# Patient Record
Sex: Male | Born: 1980 | Race: Black or African American | Hispanic: No | Marital: Single | Smoking: Current every day smoker
Health system: Southern US, Community
[De-identification: ages and names within clinical notes are randomized; demographics above are authoritative.]

---

## 2005-04-07 ENCOUNTER — Emergency Department: Payer: Self-pay | Admitting: Emergency Medicine

## 2008-01-21 ENCOUNTER — Emergency Department: Payer: Self-pay | Admitting: Emergency Medicine

## 2014-01-24 ENCOUNTER — Emergency Department: Payer: Self-pay | Admitting: Emergency Medicine

## 2016-07-16 ENCOUNTER — Ambulatory Visit: Payer: Self-pay | Admitting: Podiatry

## 2016-07-20 ENCOUNTER — Encounter: Payer: Self-pay | Admitting: Podiatry

## 2016-08-03 ENCOUNTER — Emergency Department
Admission: EM | Admit: 2016-08-03 | Discharge: 2016-08-03 | Disposition: A | Discharge status: Home / Self Care | Payer: Self-pay | Attending: Emergency Medicine | Admitting: Emergency Medicine

## 2016-08-03 ENCOUNTER — Encounter: Payer: Self-pay | Admitting: Emergency Medicine

## 2016-08-03 DIAGNOSIS — F17200 Nicotine dependence, unspecified, uncomplicated: Secondary | ICD-10-CM | POA: Insufficient documentation

## 2016-08-03 DIAGNOSIS — T148XXA Other injury of unspecified body region, initial encounter: Secondary | ICD-10-CM

## 2016-08-03 DIAGNOSIS — X58XXXA Exposure to other specified factors, initial encounter: Secondary | ICD-10-CM | POA: Insufficient documentation

## 2016-08-03 DIAGNOSIS — Y939 Activity, unspecified: Secondary | ICD-10-CM | POA: Insufficient documentation

## 2016-08-03 DIAGNOSIS — Y929 Unspecified place or not applicable: Secondary | ICD-10-CM | POA: Insufficient documentation

## 2016-08-03 DIAGNOSIS — S29012A Strain of muscle and tendon of back wall of thorax, initial encounter: Secondary | ICD-10-CM | POA: Insufficient documentation

## 2016-08-03 DIAGNOSIS — Y999 Unspecified external cause status: Secondary | ICD-10-CM | POA: Insufficient documentation

## 2016-08-03 MED ORDER — CYCLOBENZAPRINE HCL 10 MG PO TABS
5.0000 mg | ORAL_TABLET | Freq: Once | ORAL | Status: AC
  Administered 2016-08-03: 5 mg via ORAL
  Filled 2016-08-03: qty 1

## 2016-08-03 MED ORDER — TRAMADOL HCL 50 MG PO TABS: 50.0000 mg | ORAL_TABLET | Freq: Four times a day (QID) | ORAL | 0 refills | Status: AC | PRN

## 2016-08-03 MED ORDER — TRAMADOL HCL 50 MG PO TABS
50.0000 mg | ORAL_TABLET | Freq: Once | ORAL | Status: AC
  Administered 2016-08-03: 50 mg via ORAL
  Filled 2016-08-03: qty 1

## 2016-08-03 MED ORDER — CYCLOBENZAPRINE HCL 10 MG PO TABS: 10.0000 mg | ORAL_TABLET | Freq: Three times a day (TID) | ORAL | 0 refills | Status: DC | PRN

## 2016-08-03 NOTE — ED Triage Notes (Signed)
Upper mid back pain x1 day , no injury , increased pain on palpation , increased pain on deep inspirations.  No noted breathing difficulty.

## 2016-08-03 NOTE — ED Provider Notes (Signed)
[facility] Emergency Department Provider Note   ____________________________________________   First MD Initiated Contact with Patient 08/03/16 1125     (approximate)  I have reviewed the triage vital signs and the nursing notes.   HISTORY  Chief Complaint Back Pain    HPI Randy Shelton is a 35 y.o. male patient complaining of back pain on the right side for one day. Patient denies any provocative incident for this complaint. Patient state pain started upon arising from bed yesterday. Patient state mild relief taking ibuprofen. Patient denies any loss of sensation or loss of function of the upper extremity. Patient stated pain increases with abduction overhead reaching. Patient posted to the mid scapular area as a source of pain.Patient rates the pain as 8/10. Describes the pain as "sore". Patient is right-hand dominant.   History reviewed. No pertinent past medical history.  There are no active problems to display for this patient.   History reviewed. No pertinent surgical history.  Prior to Admission medications   Medication Sig Start Date End Date Taking? Authorizing Provider  cyclobenzaprine (FLEXERIL) 10 MG tablet Take 1 tablet (10 mg total) by mouth 3 (three) times daily as needed. 08/03/16   Joni Reiningonald K Bristal Steffy, PA-C  traMADol (ULTRAM) 50 MG tablet Take 1 tablet (50 mg total) by mouth every 6 (six) hours as needed. 08/03/16 08/03/17  Joni Reiningonald K Kamel Haven, PA-C    Allergies Review of patient's allergies indicates no known allergies.  History reviewed. No pertinent family history.  Social History Social History  Substance Use Topics  . Smoking status: Current Every Day Smoker  . Smokeless tobacco: Never Used  . Alcohol use Not on file    Review of Systems Constitutional: No fever/chills Eyes: No visual changes. ENT: No sore throat. Cardiovascular: Denies chest pain. Respiratory: Denies shortness of breath. Gastrointestinal: No abdominal  pain.  No nausea, no vomiting.  No diarrhea.  No constipation. Genitourinary: Negative for dysuria. Musculoskeletal: Positive for right upper back pain Skin: Negative for rash. Neurological: Negative for headaches, focal weakness or numbness. ____________________________________________   PHYSICAL EXAM:  VITAL SIGNS: ED Triage Vitals  Enc Vitals Group     BP 08/03/16 1043 (!) 150/84     Pulse Rate 08/03/16 1043 67     Resp 08/03/16 1043 18     Temp 08/03/16 1043 98.2 F (36.8 C)     Temp Source 08/03/16 1043 Oral     SpO2 08/03/16 1043 100 %     Weight 08/03/16 1044 191 lb (86.6 kg)     Height 08/03/16 1044 5\' 6"  (1.676 m)     Head Circumference --      Peak Flow --      Pain Score 08/03/16 1109 8     Pain Loc --      Pain Edu? --      Excl. in GC? --     Constitutional: Alert and oriented. Well appearing and in no acute distress. Eyes: Conjunctivae are normal. PERRL. EOMI. Head: Atraumatic. Nose: No congestion/rhinnorhea. Mouth/Throat: Mucous membranes are moist.  Oropharynx non-erythematous. Neck: No stridor.  No cervical spine tenderness to palpation. Hematological/Lymphatic/Immunilogical: No cervical lymphadenopathy. Cardiovascular: Normal rate, regular rhythm. Grossly normal heart sounds.  Good peripheral circulation. Respiratory: Normal respiratory effort.  No retractions. Lungs CTAB. Gastrointestinal: Soft and nontender. No distention. No abdominal bruits. No CVA tenderness. Musculoskeletal: No obvious deformity of the upper back. No edema or erythema. Patient has some moderate guarding with diminished capillary area medial aspect.  Neurologic:  Normal speech and language. No gross focal neurologic deficits are appreciated. No gait instability. Skin:  Skin is warm, dry and intact. No rash noted. Psychiatric: Mood and affect are normal. Speech and behavior are normal.  ____________________________________________   LABS (all labs ordered are listed, but only  abnormal results are displayed)  Labs Reviewed - No data to display ____________________________________________  EKG   ____________________________________________  RADIOLOGY   ____________________________________________   PROCEDURES  Procedure(s) performed: None  Procedures  Critical Care performed: No  ____________________________________________   INITIAL IMPRESSION / ASSESSMENT AND PLAN / ED COURSE  Pertinent labs & imaging results that were available during my care of the patient were reviewed by me and considered in my medical decision making (see chart for details).  Right scapular muscle strain. Patient given discharge Instructions. Patient given a prescription for tramadol and Flexeril. Patient advised to follow "clinic if condition persists.  Clinical Course     ____________________________________________   FINAL CLINICAL IMPRESSION(S) / ED DIAGNOSES  Final diagnoses:  Muscle strain      NEW MEDICATIONS STARTED DURING THIS VISIT:  New Prescriptions   CYCLOBENZAPRINE (FLEXERIL) 10 MG TABLET    Take 1 tablet (10 mg total) by mouth 3 (three) times daily as needed.   TRAMADOL (ULTRAM) 50 MG TABLET    Take 1 tablet (50 mg total) by mouth every 6 (six) hours as needed.     Note:  This document was prepared using Dragon voice recognition software and may include unintentional dictation errors.    Joni Reining, PA-C 08/03/16 1133    Governor Rooks, MD 08/03/16 [phone]

## 2016-11-05 NOTE — Progress Notes (Signed)
This encounter was created in error - please disregard.

## 2019-08-14 ENCOUNTER — Other Ambulatory Visit: Payer: Self-pay

## 2019-08-14 DIAGNOSIS — Z20822 Contact with and (suspected) exposure to covid-19: Secondary | ICD-10-CM

## 2019-08-16 LAB — NOVEL CORONAVIRUS, NAA: SARS-CoV-2, NAA: NOT DETECTED

## 2020-09-26 ENCOUNTER — Emergency Department
Admission: EM | Admit: 2020-09-26 | Discharge: 2020-09-26 | Disposition: A | Discharge status: Home / Self Care | Payer: Self-pay | Attending: Emergency Medicine | Admitting: Emergency Medicine

## 2020-09-26 ENCOUNTER — Emergency Department: Payer: Self-pay

## 2020-09-26 ENCOUNTER — Other Ambulatory Visit: Payer: Self-pay

## 2020-09-26 DIAGNOSIS — N433 Hydrocele, unspecified: Secondary | ICD-10-CM | POA: Insufficient documentation

## 2020-09-26 DIAGNOSIS — F17200 Nicotine dependence, unspecified, uncomplicated: Secondary | ICD-10-CM | POA: Insufficient documentation

## 2020-09-26 DIAGNOSIS — N5089 Other specified disorders of the male genital organs: Secondary | ICD-10-CM

## 2020-09-26 DIAGNOSIS — R300 Dysuria: Secondary | ICD-10-CM | POA: Insufficient documentation

## 2020-09-26 LAB — URINALYSIS, COMPLETE (UACMP) WITH MICROSCOPIC
Bacteria, UA: NONE SEEN
Bilirubin Urine: NEGATIVE
Glucose, UA: NEGATIVE mg/dL
Hgb urine dipstick: NEGATIVE
Ketones, ur: NEGATIVE mg/dL
Leukocytes,Ua: NEGATIVE
Nitrite: NEGATIVE
Protein, ur: NEGATIVE mg/dL
Specific Gravity, Urine: 1.029 (ref 1.005–1.030)
Squamous Epithelial / HPF: NONE SEEN (ref 0–5)
pH: 6 (ref 5.0–8.0)

## 2020-09-26 LAB — CHLAMYDIA/NGC RT PCR (ARMC ONLY)
Chlamydia Tr: DETECTED — AB
N gonorrhoeae: NOT DETECTED

## 2020-09-26 MED ORDER — AZITHROMYCIN 500 MG PO TABS
2000.0000 mg | ORAL_TABLET | Freq: Once | ORAL | Status: AC
  Administered 2020-09-26: 15:00:00 2000 mg via ORAL
  Filled 2020-09-26: qty 4

## 2020-09-26 MED ORDER — CEFTRIAXONE SODIUM 250 MG IJ SOLR
250.0000 mg | Freq: Once | INTRAMUSCULAR | Status: AC
  Administered 2020-09-26: 15:00:00 250 mg via INTRAMUSCULAR
  Filled 2020-09-26: qty 250

## 2020-09-26 NOTE — Discharge Instructions (Addendum)
Please use ibuprofen/Tylenol for any continued pain.  Please use ice packs to the left testicle, 10 minutes on and 10 minutes off for continued pain control

## 2020-09-26 NOTE — ED Triage Notes (Signed)
PT to ED for L testicle swelling since yesterday, states it is red too. When asked states "just a little bit" of trouble urinating, but IS able to pass. Burns occasionally. No new sexual partners, as far as injury, pt states hey "may have sat on it".

## 2020-09-26 NOTE — ED Provider Notes (Signed)
[facility] Emergency Department Provider Note   ____________________________________________   Event Date/Time   First MD Initiated Contact with Patient 09/26/20 1312     (approximate)  I have reviewed the triage vital signs and the nursing notes.   HISTORY  Chief Complaint Groin Swelling    HPI Randy Shelton is a 39 y.o. male with no stated past medical history presents for left testicular pain over the last 4 hours.  Patient describes aching, 7/10, nonradiating left scrotal pain with associated erythema and swelling.  Patient states that any palpation or walking worsens this pain and is partially relieved at rest.  Patient denies any unprotected sexual contact and/or new sexual partners.  Patient endorses mild dysuria         History reviewed. No pertinent past medical history.  There are no problems to display for this patient.   History reviewed. No pertinent surgical history.  Prior to Admission medications   Medication Sig Start Date End Date Taking? Authorizing Provider  cyclobenzaprine (FLEXERIL) 10 MG tablet Take 1 tablet (10 mg total) by mouth 3 (three) times daily as needed. 08/03/16   Joni Reining, PA-C    Allergies Patient has no known allergies.  No family history on file.  Social History Social History   Tobacco Use   Smoking status: Current Every Day Smoker    Packs/day: 0.50   Smokeless tobacco: Never Used  Substance Use Topics   Drug use: Never    Review of Systems Constitutional: No fever/chills Eyes: No visual changes. ENT: No sore throat. Cardiovascular: Denies chest pain. Respiratory: Denies shortness of breath. Gastrointestinal: No abdominal pain.  No nausea, no vomiting.  No diarrhea. Genitourinary: Positive for dysuria.  Positive for left testicular swelling Musculoskeletal: Negative for acute arthralgias Skin: Negative for rash. Neurological: Negative for headaches,  weakness/numbness/paresthesias in any extremity Psychiatric: Negative for suicidal ideation/homicidal ideation   ____________________________________________   PHYSICAL EXAM:  VITAL SIGNS: ED Triage Vitals  Enc Vitals Group     BP 09/26/20 1234 (!) 143/75     Pulse Rate 09/26/20 1234 (!) 52     Resp 09/26/20 1234 18     Temp 09/26/20 1234 99.2 F (37.3 C)     Temp Source 09/26/20 1234 Oral     SpO2 09/26/20 1234 98 %     Weight 09/26/20 1238 190 lb (86.2 kg)     Height 09/26/20 1238 5\' 6"  (1.676 m)     Head Circumference --      Peak Flow --      Pain Score 09/26/20 1238 8     Pain Loc --      Pain Edu? --      Excl. in GC? --    Constitutional: Alert and oriented. Well appearing and in no acute distress. Eyes: Conjunctivae are normal. PERRL. Head: Atraumatic. Nose: No congestion/rhinnorhea. Mouth/Throat: Mucous membranes are moist. Neck: No stridor Cardiovascular: Grossly normal heart sounds.  Good peripheral circulation. Respiratory: Normal respiratory effort.  No retractions. Gastrointestinal: Soft and nontender. No distention. Genitourinary: Normal external circumcised male genitalia.  Left testicular edema, erythema, and significant tenderness to palpation Musculoskeletal: No obvious deformities Neurologic:  Normal speech and language. No gross focal neurologic deficits are appreciated. Skin:  Skin is warm and dry. No rash noted. Psychiatric: Mood and affect are normal. Speech and behavior are normal.  ____________________________________________   LABS (all labs ordered are listed, but only abnormal results are displayed)  Labs Reviewed  CHLAMYDIA/NGC RT  PCR ([facility] ONLY) - Abnormal; Notable for the following components:      Result Value   Chlamydia Tr DETECTED (*)    All other components within normal limits  URINALYSIS, COMPLETE (UACMP) WITH MICROSCOPIC - Abnormal; Notable for the following components:   Color, Urine YELLOW (*)    APPearance  CLEAR (*)    All other components within normal limits    RADIOLOGY  ED MD interpretation: Ultrasound of the testicles with Doppler shows bilateral hydroceles and no other focal abnormality noted  Official radiology report(s): US SCROTUM W/DOPPLER  Result Date: 09/26/2020 CLINICAL DATA:  Testicular swelling EXAM: SCROTAL ULTRASOUND DOPPLER ULTRASOUND OF THE TESTICLES TECHNIQUE: Complete ultrasound examination of the testicles, epididymis, and other scrotal structures was performed. Color and spectral Doppler ultrasound were also utilized to evaluate blood flow to the testicles. COMPARISON:  None. FINDINGS: Right testicle Measurements: 4.5 x 2.4 x 2.9 cm. No mass or microlithiasis visualized. Left testicle Measurements: 3.9 x 2.6 x 3.3 cm. No mass or microlithiasis visualized. Right epididymis:  Normal in size and appearance. Left epididymis: The epididymis is within normal limits although the tail is mildly prominent. Hydrocele:  Small bilateral hydroceles are seen. Varicocele:  None visualized. Pulsed Doppler interrogation of both testes demonstrates normal low resistance arterial and venous waveforms bilaterally. IMPRESSION: Small bilateral hydroceles.  No other focal abnormality is noted. Electronically Signed   By: Alcide Clever M.D.   On: 09/26/2020 12:35    ____________________________________________   PROCEDURES  Procedure(s) performed (including Critical Care):  Procedures   ____________________________________________   INITIAL IMPRESSION / ASSESSMENT AND PLAN / ED COURSE  As part of my medical decision making, I reviewed the following data within the electronic MEDICAL RECORD NUMBER Nursing notes reviewed and incorporated, Labs reviewed, Old chart reviewed, Radiograph reviewed and Notes from prior ED visits reviewed and incorporated        Patient with no significant medical history who presents with dysuria.  Exam and history not consistent with herpes, syphilis,  epididymo-orchitis, balanoposthitis, deep space GU infection, prostatitis, or acute abdomen. Workup: UA, gonorrhea/chlamydia RNA Findings: UA WNL ED Tx: ceftriaxone 250mg  IM, azithromycin 1g PO  Disposition: Counseled regarding safe sex practices and partner notification. Discussed return precautions and clinic or primary care follow up within 48 hours. DC home.       ____________________________________________   FINAL CLINICAL IMPRESSION(S) / ED DIAGNOSES  Final diagnoses:  Testicular swelling, left  Bilateral hydrocele     ED Discharge Orders    None       Note:  This document was prepared using Dragon voice recognition software and may include unintentional dictation errors.   , MD 09/26/20 [phone]

## 2020-10-07 ENCOUNTER — Telehealth: Payer: Self-pay | Admitting: Emergency Medicine

## 2020-10-07 NOTE — Telephone Encounter (Signed)
Called pateint and informed him of positive chlamydia test on 09/26/20.  He was treated during ED visit.  Informed of need for partner treatment and free treatment always available at [facility].

## 2021-05-02 IMAGING — US US SCROTUM W/ DOPPLER COMPLETE
1 series · 14 of 25 positions shown · non-contrast
Comparison: None.

CLINICAL DATA: Testicular swelling

EXAM:
SCROTAL ULTRASOUND
DOPPLER ULTRASOUND OF THE TESTICLES
TECHNIQUE: Complete ultrasound examination of the testicles, epididymis, and
other scrotal structures was performed. Color and spectral Doppler
ultrasound were also utilized to evaluate blood flow to the
testicles.

[Series 1: us scrotum w/ doppler complete · 0.08mm/px · 14 of 44 slices shown]
[im 1/44]
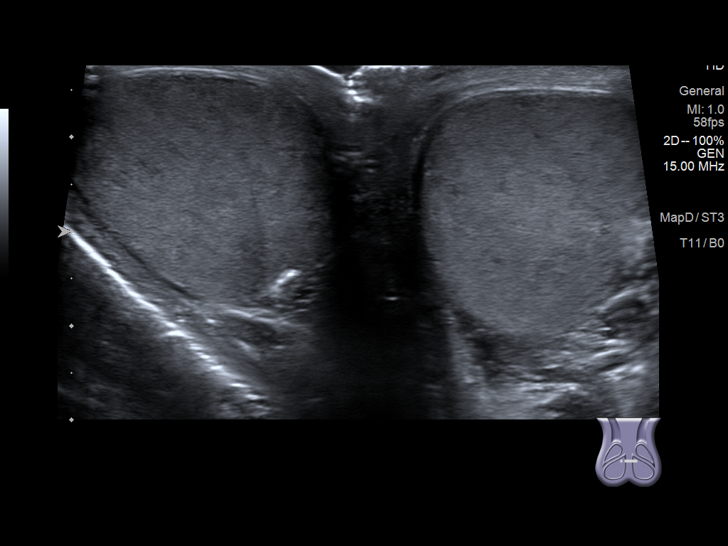
[im 4/44]
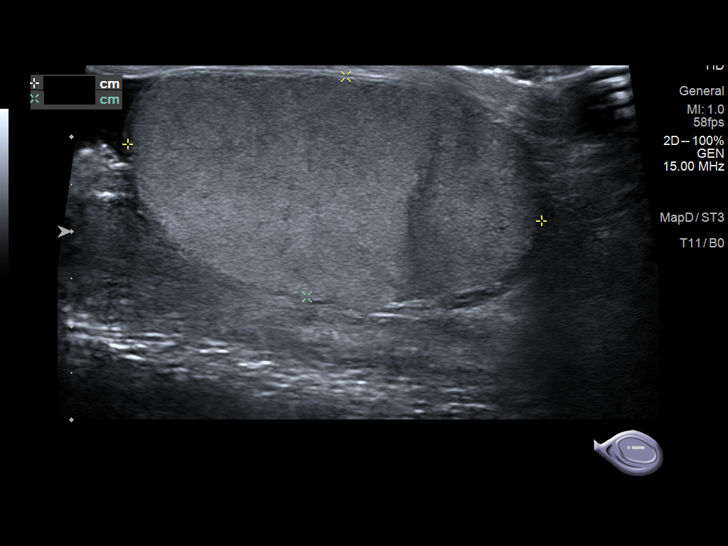
[im 8/44]
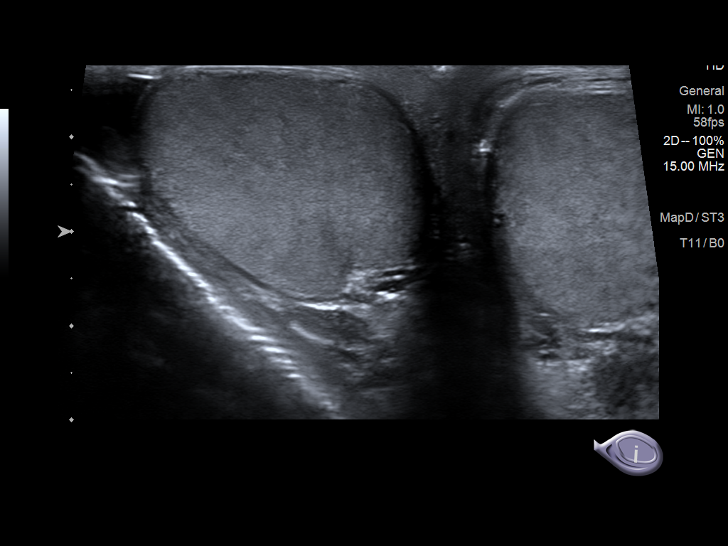
[im 11/44]
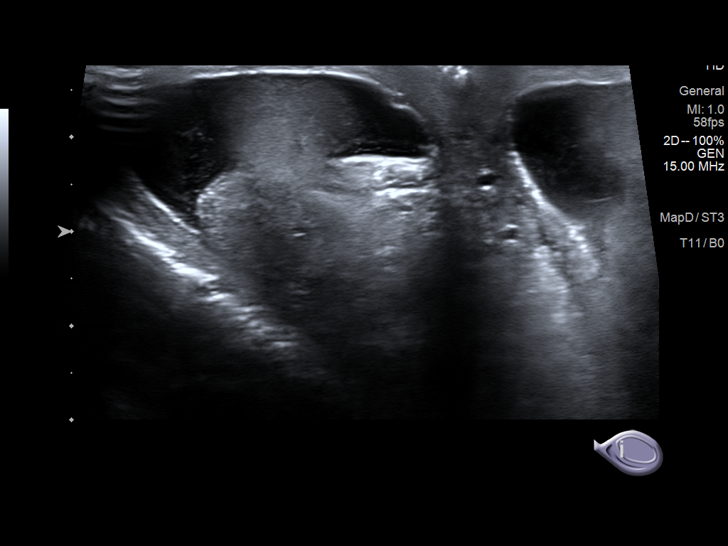
[im 15/44]
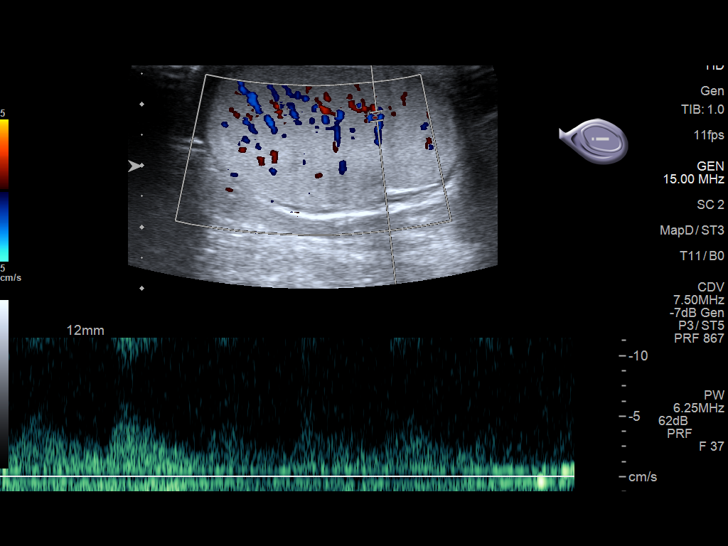
[im 17/44]
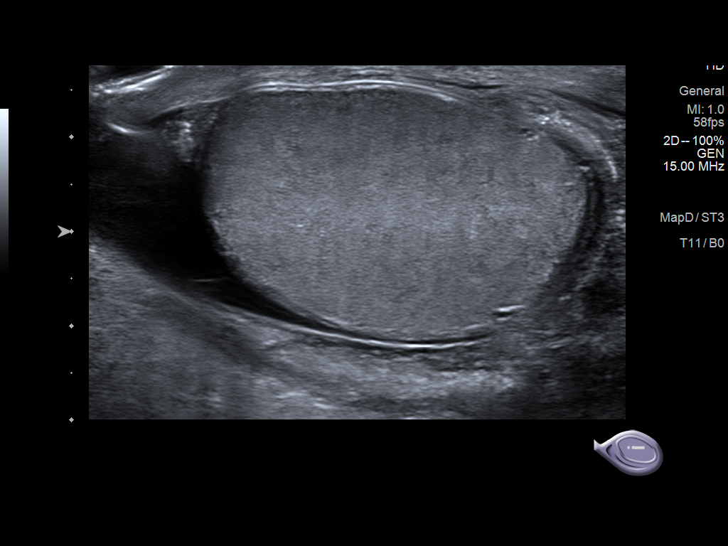
[im 20/44]
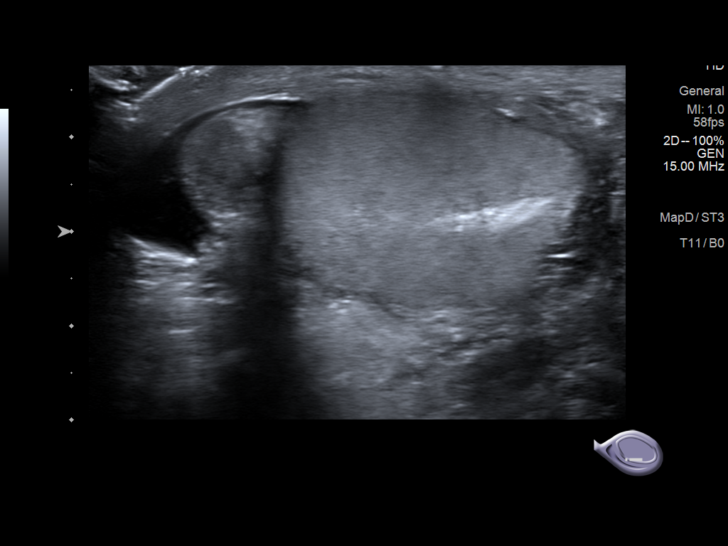
[im 24/44]
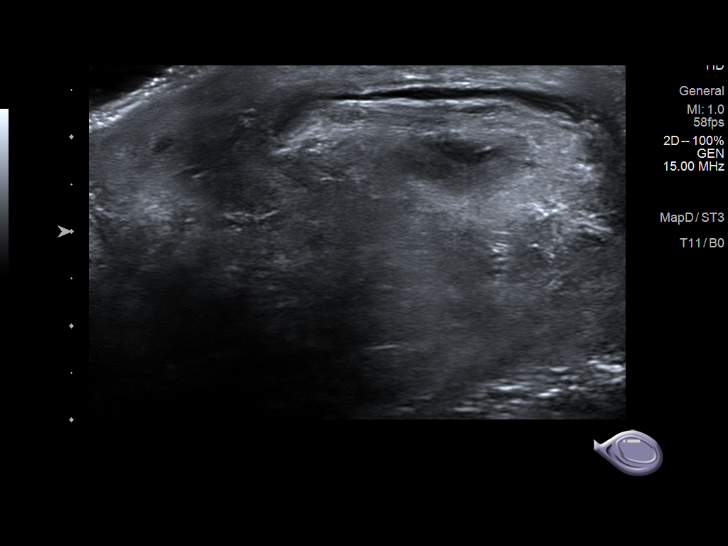
[im 27/44]
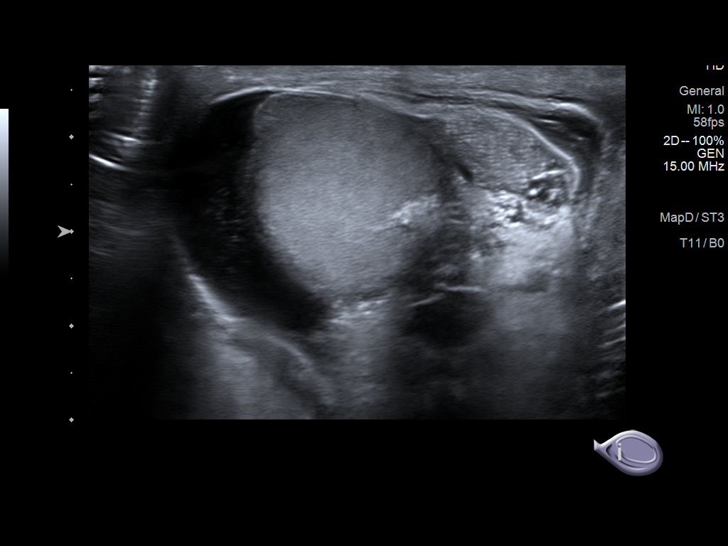
[im 29/44]
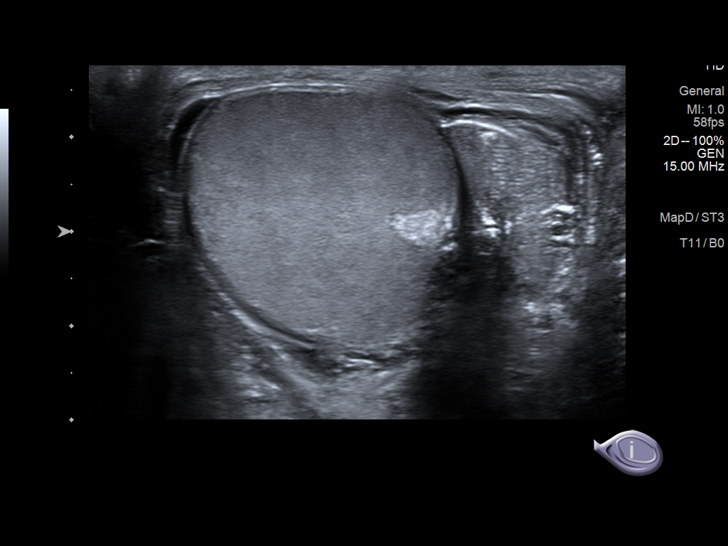
[im 33/44]
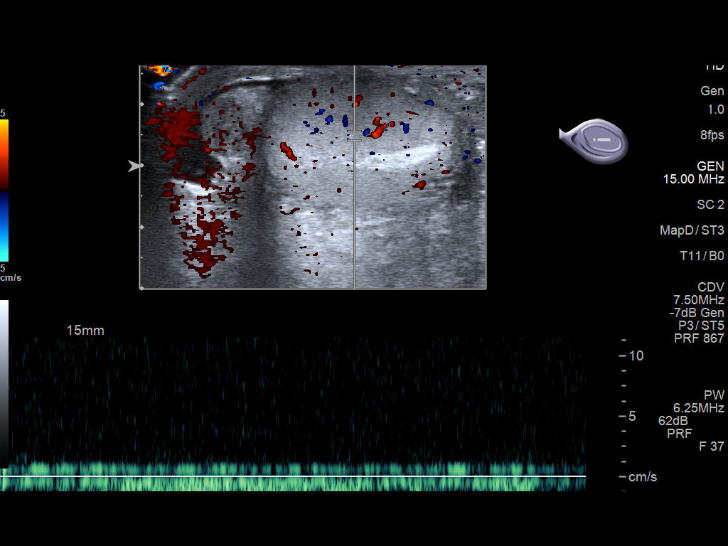
[im 36/44]
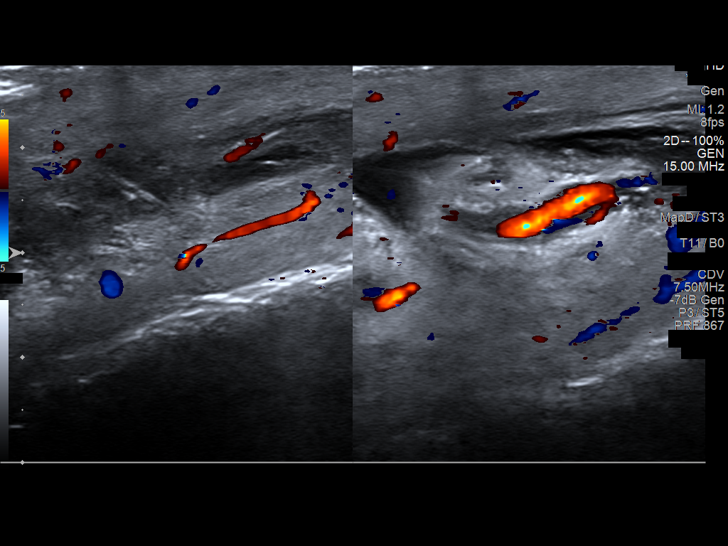
[im 40/44]
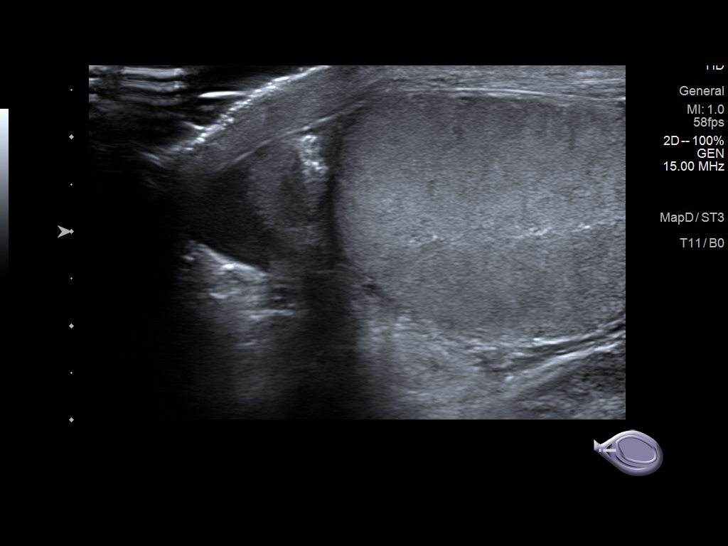
[im 44/44]
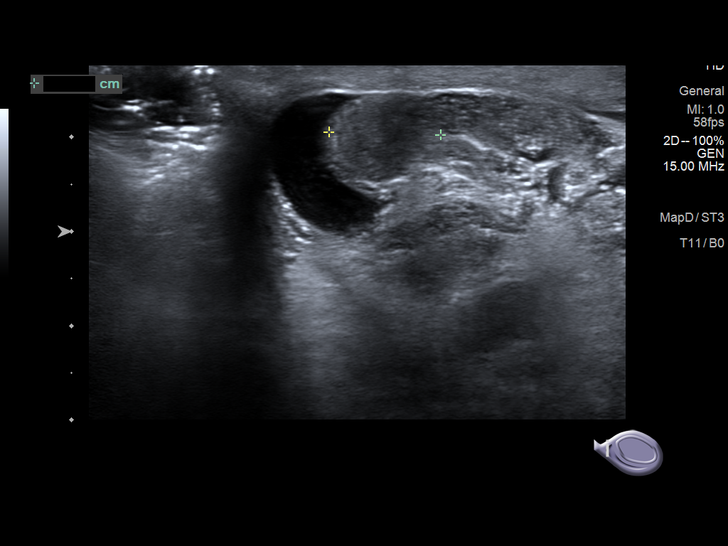

[14 of 25 positions shown; findings below may reference images not displayed]

FINDINGS: Right testicle

Measurements: 4.5 x 2.4 x 2.9 cm. No mass or microlithiasis
visualized.

Left testicle

Measurements: 3.9 x 2.6 x 3.3 cm. No mass or microlithiasis
visualized.

Right epididymis:  Normal in size and appearance.

Left epididymis: The epididymis is within normal limits although the
tail is mildly prominent.

Hydrocele:  Small bilateral hydroceles are seen.

Varicocele:  None visualized.

Pulsed Doppler interrogation of both testes demonstrates normal low
resistance arterial and venous waveforms bilaterally.
IMPRESSION: Small bilateral hydroceles.  No other focal abnormality is noted.

## 2023-11-22 ENCOUNTER — Other Ambulatory Visit: Payer: Self-pay | Admitting: Family Medicine

## 2023-11-22 ENCOUNTER — Ambulatory Visit
Admission: RE | Admit: 2023-11-22 | Discharge: 2023-11-22 | Disposition: A | Discharge status: Home / Self Care | Payer: Medicaid Other | Source: Ambulatory Visit | Attending: Family Medicine | Admitting: Family Medicine

## 2023-11-22 ENCOUNTER — Ambulatory Visit
Admission: RE | Admit: 2023-11-22 | Discharge: 2023-11-22 | Disposition: A | Discharge status: Home / Self Care | Payer: Medicaid Other | Attending: Family Medicine | Admitting: Family Medicine

## 2023-11-22 DIAGNOSIS — M5412 Radiculopathy, cervical region: Secondary | ICD-10-CM

## 2023-11-22 DIAGNOSIS — M79632 Pain in left forearm: Secondary | ICD-10-CM | POA: Diagnosis not present

## 2023-11-22 DIAGNOSIS — M25512 Pain in left shoulder: Secondary | ICD-10-CM | POA: Diagnosis not present

## 2024-03-19 ENCOUNTER — Ambulatory Visit
Admission: EM | Admit: 2024-03-19 | Discharge: 2024-03-19 | Disposition: A | Discharge status: Home / Self Care | Attending: Family Medicine | Admitting: Family Medicine

## 2024-03-19 DIAGNOSIS — G44209 Tension-type headache, unspecified, not intractable: Secondary | ICD-10-CM

## 2024-03-19 DIAGNOSIS — Z87898 Personal history of other specified conditions: Secondary | ICD-10-CM | POA: Diagnosis not present

## 2024-03-19 LAB — GLUCOSE, CAPILLARY: Glucose-Capillary: 87 mg/dL (ref 70–99)

## 2024-03-19 MED ORDER — CYCLOBENZAPRINE HCL 5 MG PO TABS: 5.0000 mg | ORAL_TABLET | Freq: Three times a day (TID) | ORAL | 0 refills | Status: AC | PRN

## 2024-03-19 MED ORDER — NAPROXEN 500 MG PO TABS: 500.0000 mg | ORAL_TABLET | Freq: Two times a day (BID) | ORAL | 0 refills | Status: AC

## 2024-03-19 NOTE — ED Provider Notes (Signed)
 [facility]    CSN: [id number] Arrival date & time: 03/19/24  0934      History   Chief Complaint Chief Complaint  Patient presents with   Headache   Hypoglycemia    HPI Randy Shelton is a 43 y.o. male.   HPI  History obtained from the patient. Randy Shelton presents for intermittent headaches for the past couple weeks.  Typically occur once a week. Denies history of migraines. No light or sound sensitivity.Randy Shelton ear ringing.    He is pre-diabetic and is concerned that his blood sugar. Has cough and rhinorrhea. No fever, nausea, vomiting, diarrhea.    Home COVID test is negative.     He was incarated for 40 days and he just got out. He did not eat today and thinks his blood sugar is low.     No past medical history on file.  There are no active problems to display for this patient.   No past surgical history on file.     Home Medications    Prior to Admission medications   Medication Sig Start Date End Date Taking? Authorizing Provider  naproxen  (NAPROSYN ) 500 MG tablet Take 1 tablet (500 mg total) by mouth 2 (two) times daily with a meal. 03/19/24  Yes Lilac Hoff, DO  cyclobenzaprine  (FLEXERIL ) 5 MG tablet Take 1-2 tablets (5-10 mg total) by mouth 3 (three) times daily as needed. 03/19/24   Lynsi Dooner, DO    Family History No family history on file.  Social History Social History   Tobacco Use   Smoking status: Every Day    Current packs/day: 0.50    Types: Cigarettes   Smokeless tobacco: Never  Substance Use Topics   Drug use: Never     Allergies   Patient has no known allergies.   Review of Systems Review of Systems: negative unless otherwise stated in HPI.      Physical Exam Triage Vital Signs ED Triage Vitals  Encounter Vitals Group     BP 03/19/24 1027 (!) 131/92     Systolic BP Percentile --      Diastolic BP Percentile --      Pulse Rate 03/19/24 1027 63     Resp 03/19/24 1027 16     Temp 03/19/24 1027 98.2 F  (36.8 C)     Temp Source 03/19/24 1027 Oral     SpO2 03/19/24 1027 97 %     Weight --      Height --      Head Circumference --      Peak Flow --      Pain Score 03/19/24 1026 0     Pain Loc --      Pain Education --      Exclude from Growth Chart --    No data found.  Updated Vital Signs BP (!) 131/92 (BP Location: Left Arm)   Pulse 63   Temp 98.2 F (36.8 C) (Oral)   Resp 16   SpO2 97%   Visual Acuity Right Eye Distance:   Left Eye Distance:   Bilateral Distance:    Right Eye Near:   Left Eye Near:    Bilateral Near:     Physical Exam GEN:     alert, non-toxic appearing male in no distress    HENT:  mucus membranes moist, oropharyngeal without lesions or erythema, no nasal discharge, bilateral TM normal EYES:   no scleral injection or discharge NECK:  normal ROM, no  lymphadenopathy RESP:  no increased work of breathing, clear to auscultation bilaterally CVS:   regular rate and rhythm NEURO:  alert, oriented, speech normal, CN 2-12 grossly intact, no facial droop,  sensation grossly intact, strength 5/5 bilateral UE and LE, normal coordination Skin:   warm and dry, no rash on visible skin    UC Treatments / Results  Labs (all labs ordered are listed, but only abnormal results are displayed) Labs Reviewed  GLUCOSE, CAPILLARY  CBG MONITORING, ED    EKG   Radiology No results found.  Procedures Procedures (including critical care time)  Medications Ordered in UC Medications - No data to display  Initial Impression / Assessment and Plan / UC Course  I have reviewed the triage vital signs and the nursing notes.  Pertinent labs & imaging results that were available during my care of the patient were reviewed by me and considered in my medical decision making (see chart for details).       Pt is a 43 y.o. male who has history of prediabetes presents for intermittent headaches for a couple weeks with respiratory symptoms. Randy Shelton is afebrile here  without recent antipyretics. Satting well on room air. Overall pt is non-toxic appearing, well hydrated, without respiratory distress. Cardiopulmonary and neurological exams is unremarkable.  Home COVID test was negative.  History of prediabetes: CBG is normal. Reassurance provided. Schedule an appointment with a new PCP.  Suspect he is having tension headache. Treat with NSAID and muscle relaxer as below. Stress management techniques discussed.   Return and ED precautions given and voiced understanding. Discussed MDM, treatment plan and plan for follow-up with patient who agrees with plan.     Final Clinical Impressions(s) / UC Diagnoses   Final diagnoses:  Tension headache  History of prediabetes     Discharge Instructions      Your blood sugar is normal.  Use the muscle relaxer at bedtime at first, then as needed throughout the day. Take Naprosyn  as needed for pain.   Watch you salt intake. Try to get at least 150 minutes of moderate intensity exercise a week.   Follow up with a primary care provider.       ED Prescriptions     Medication Sig Dispense Auth. Provider   cyclobenzaprine  (FLEXERIL ) 5 MG tablet Take 1-2 tablets (5-10 mg total) by mouth 3 (three) times daily as needed. 30 tablet Chistina Roston, DO   naproxen  (NAPROSYN ) 500 MG tablet Take 1 tablet (500 mg total) by mouth 2 (two) times daily with a meal. 30 tablet Treven Holtman, DO      PDMP not reviewed this encounter.   Kriste Berth, DO 03/31/24 2118

## 2024-03-19 NOTE — ED Triage Notes (Signed)
 Patient presents to UC for hypoglycemia x 1 day.  HA x 1 week. Reports BG was 116 yesterday post meal. He states he is prediabetic. Treating HA with ibuprofen. States he was in jail for 40 days released Saturday. States he tested negative for COVID.

## 2024-03-19 NOTE — Discharge Instructions (Addendum)
 Your blood sugar is normal.  Use the muscle relaxer at bedtime at first, then as needed throughout the day. Take Naprosyn as needed for pain.   Watch you salt intake. Try to get at least 150 minutes of moderate intensity exercise a week.   Follow up with a primary care provider.
